# Patient Record
Sex: Female | Born: 2000 | Race: Black or African American | Hispanic: No | Marital: Single | Smoking: Never smoker
Health system: Southern US, Community
[De-identification: ages and names within clinical notes are randomized; demographics above are authoritative.]

## PROBLEM LIST (undated history)

## (undated) DIAGNOSIS — S8290XA Unspecified fracture of unspecified lower leg, initial encounter for closed fracture: Secondary | ICD-10-CM

## (undated) HISTORY — PX: LEG SURGERY: SHX1003

---
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
## 2020-06-30 IMAGING — US ULTRASOUND LEFT BREAST LIMITED
1 series · 12 of 12 positions shown · non-contrast
Comparison: Previous exam(s).

CLINICAL DATA: Patient presents for a diagnostic left breast
ultrasound to evaluate a palpable abnormality.

EXAM:
ULTRASOUND OF THE LEFT BREAST

[Series 1: ultrasound left breast limited · 0.06mm/px · 12 of 12 slices shown]
[im 1/12]
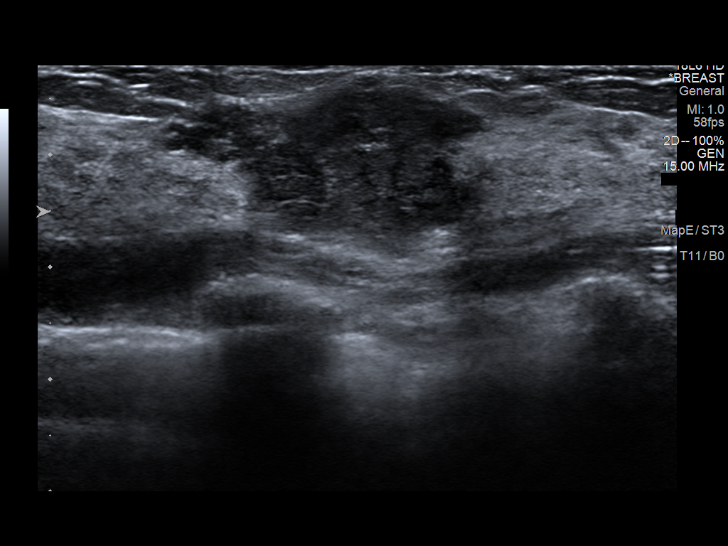
[im 2/12]
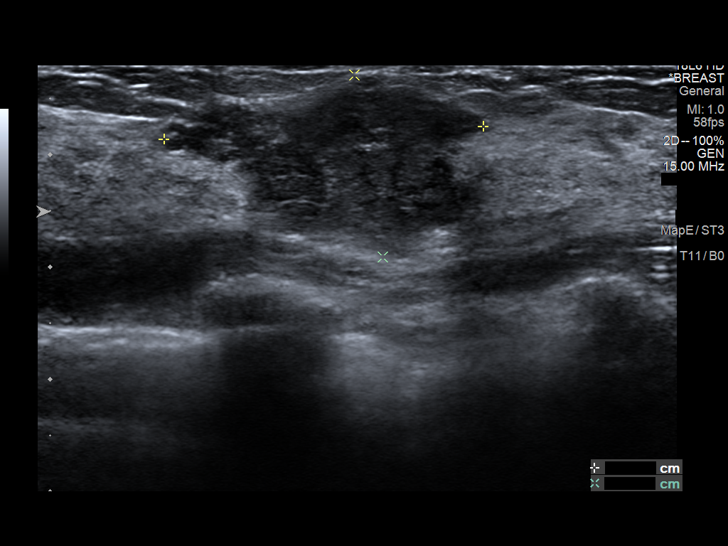
[im 3/12]
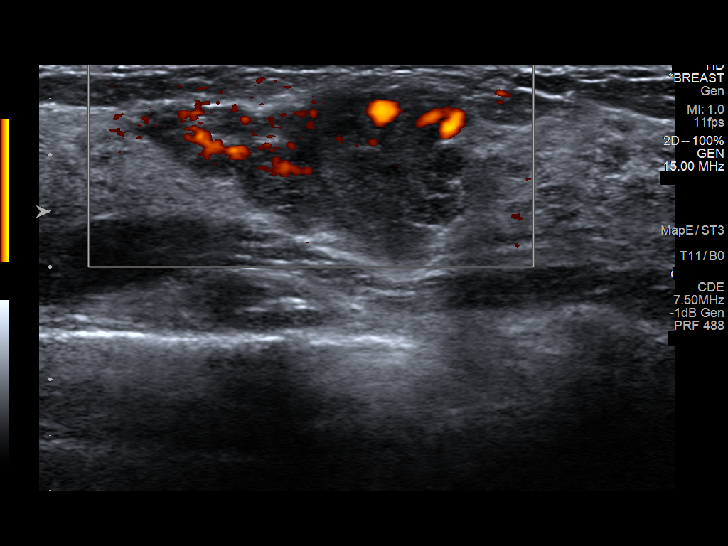
[im 4/12]
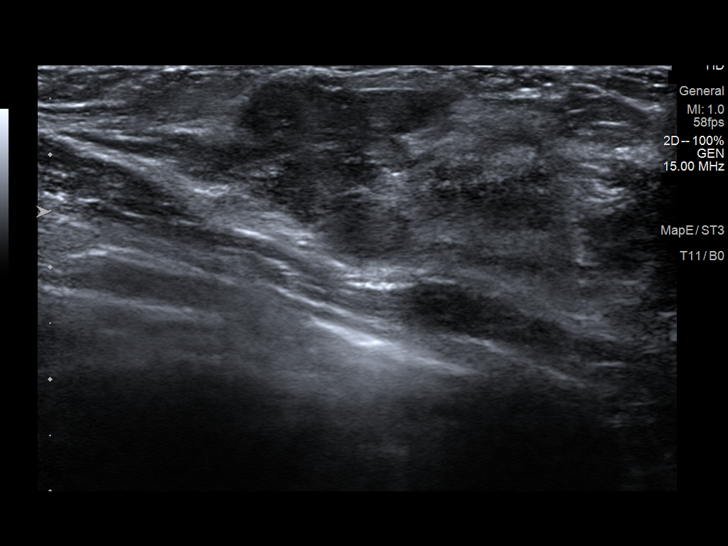
[im 5/12]
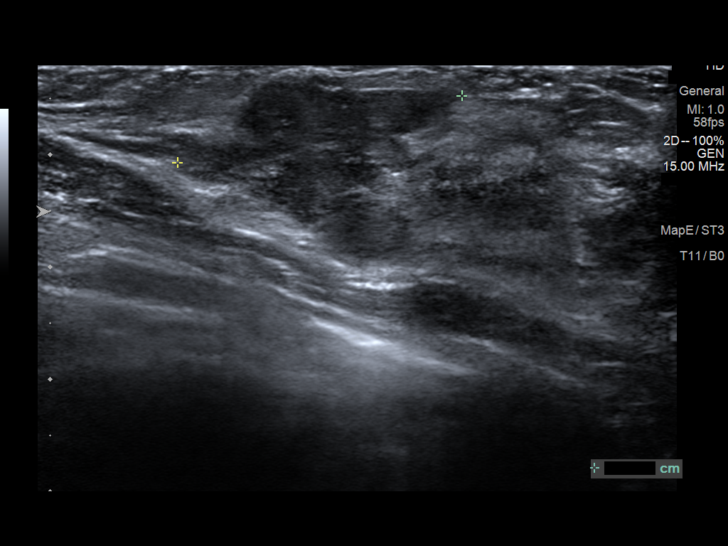
[im 6/12]
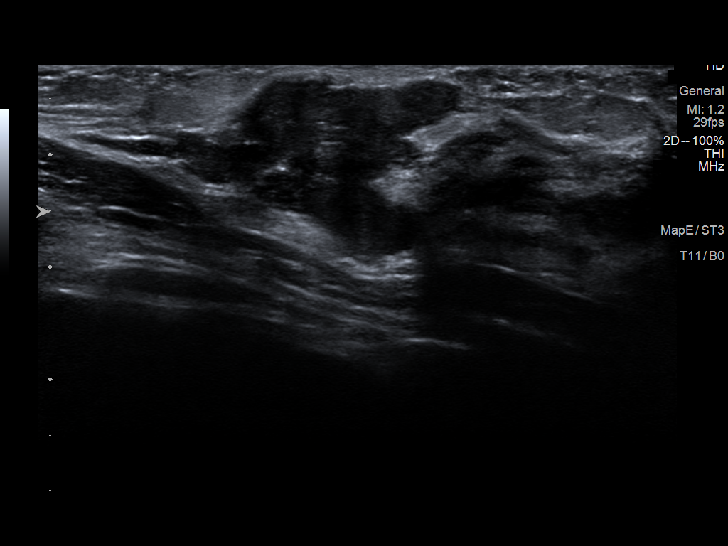
[im 7/12]
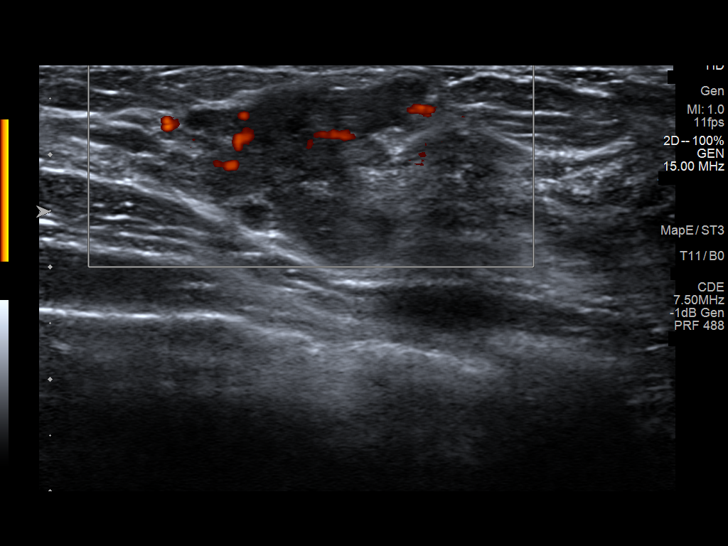
[im 8/12]
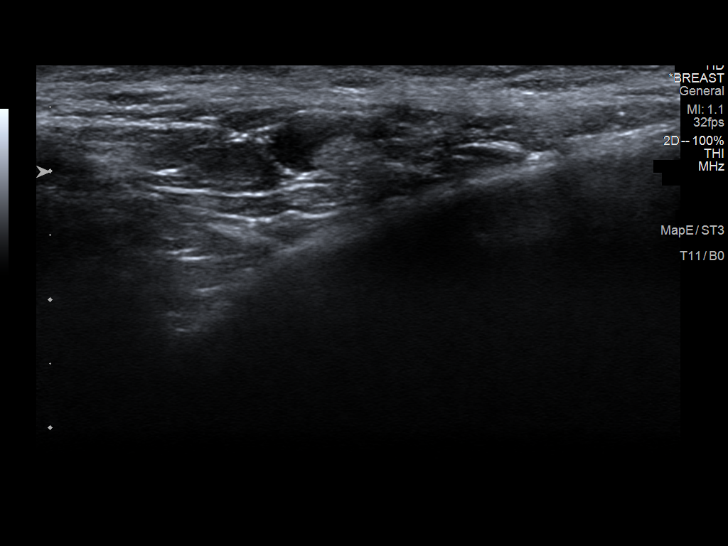
[im 9/12]
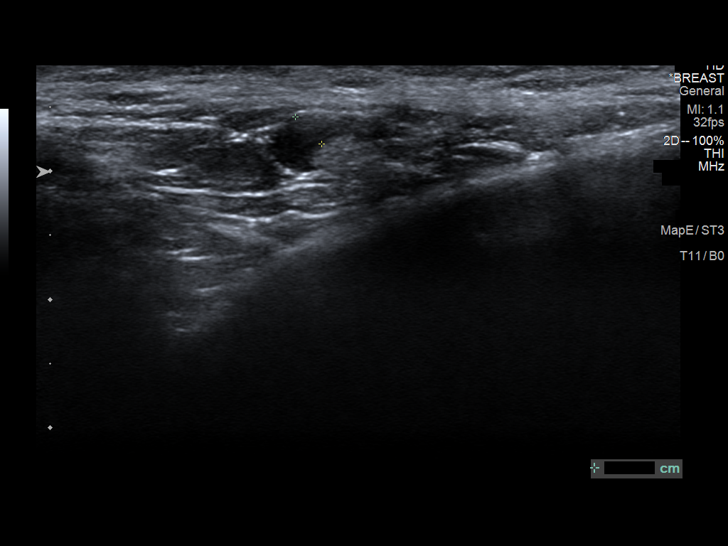
[im 10/12]
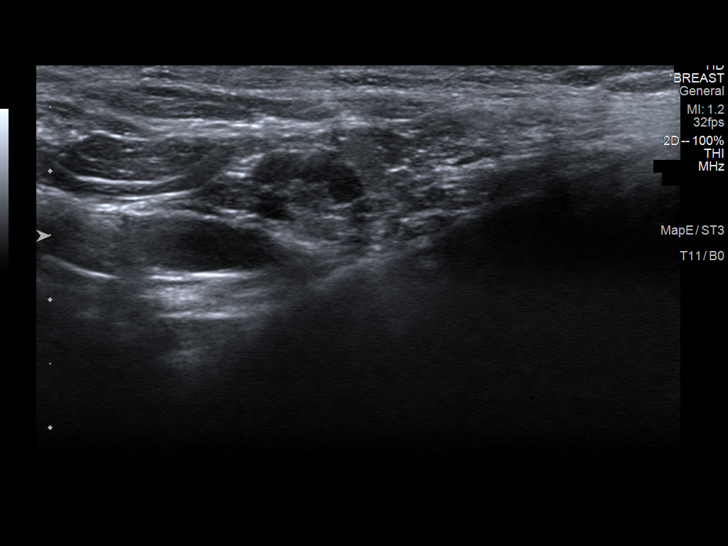
[im 11/12]
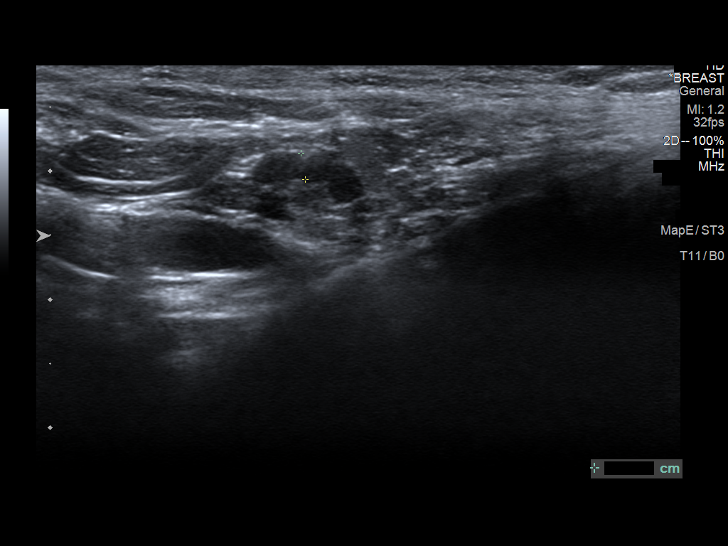
[im 12/12]
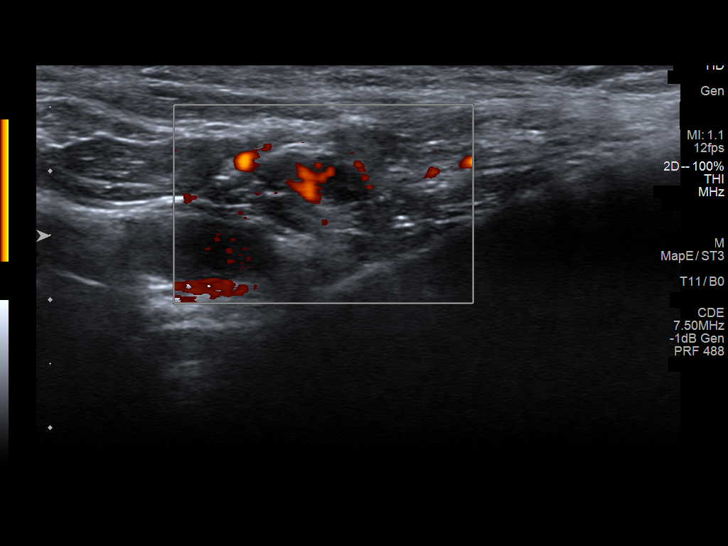

[12 of 12 positions shown; findings below may reference images not displayed]

FINDINGS: On physical exam, I palpate a 2 cm oval mobile mass over the 830-9
o'clock position of the left breast.

Targeted ultrasound is performed, showing an irregular shaped
hypoechoic mass with irregular borders over the [DATE] position of the
left breast 4 cm from the nipple measuring 1.6 x 2.6 x 2.8 cm.

Ultrasound of the left axilla demonstrates no abnormal lymph nodes.
IMPRESSION: Indeterminate 2.8 cm mass at the [DATE] position left breast 4 cm from
the nipple accounting for patient's palpable abnormality. This
likely represents an atypical fibroadenoma although is
indeterminate.

RECOMMENDATION:
Recommend ultrasound-guided core needle biopsy for further
evaluation.

I have discussed the findings and recommendations with the patient.
Results were also provided in writing at the conclusion of the
visit. If applicable, a reminder letter will be sent to the patient
regarding the next appointment.

BI-RADS CATEGORY  4: Suspicious.

Biopsy will be scheduled here at the [REDACTED]
prior to patient's departure.

## 2020-07-14 IMAGING — US US BREAST BX W LOC DEV 1ST LESION IMG BX SPEC US GUIDE*L*
1 series · 10 of 10 positions shown · non-contrast
Comparison: Previous exam(s).
COMPARISON: Previous exam(s).

Addendum:
CLINICAL DATA: 17-year-old patient with a LEFT breast mass presents
for ultrasound-guided core biopsy.

EXAM:
ULTRASOUND GUIDED LEFT BREAST CORE NEEDLE BIOPSY

[Series 1: us breast bx w loc dev 1st lesion img bx spec us g · 0.07mm/px · 10 of 10 slices shown]
[im 1/10]
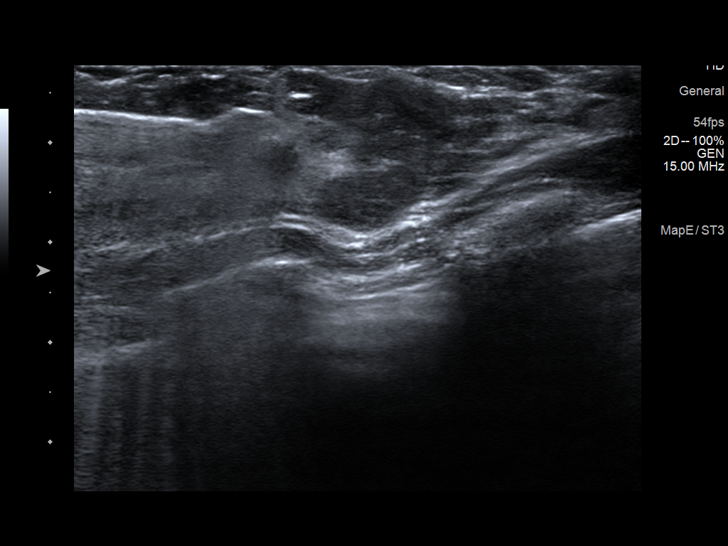
[im 2/10]
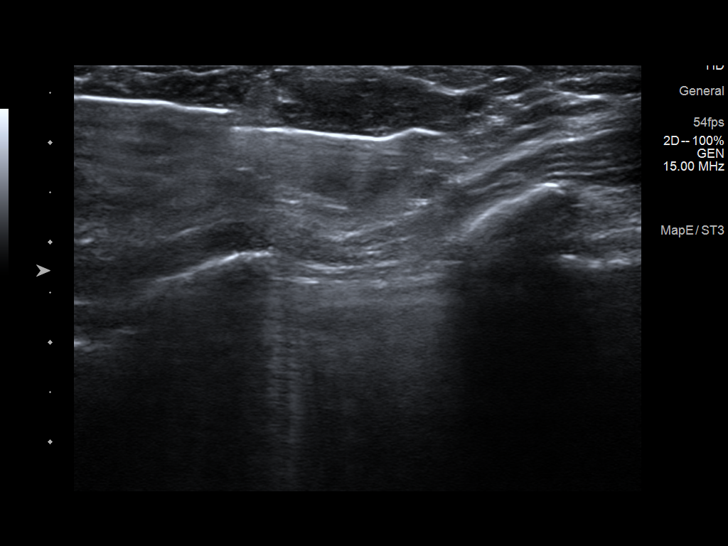
[im 3/10]
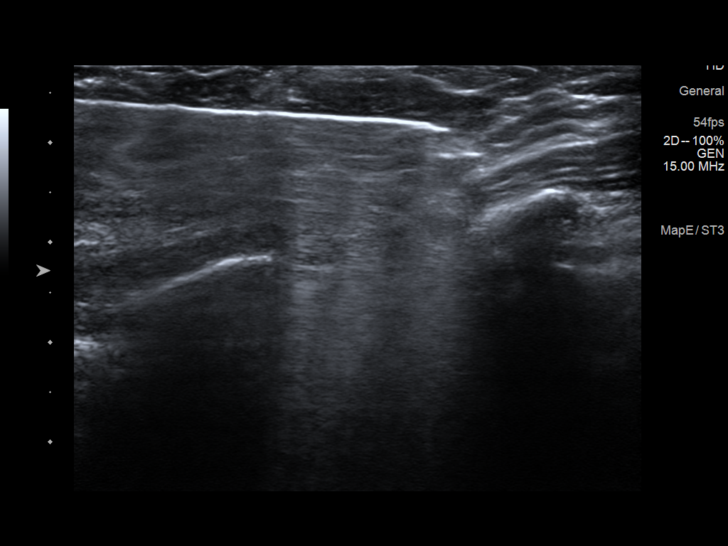
[im 4/10]
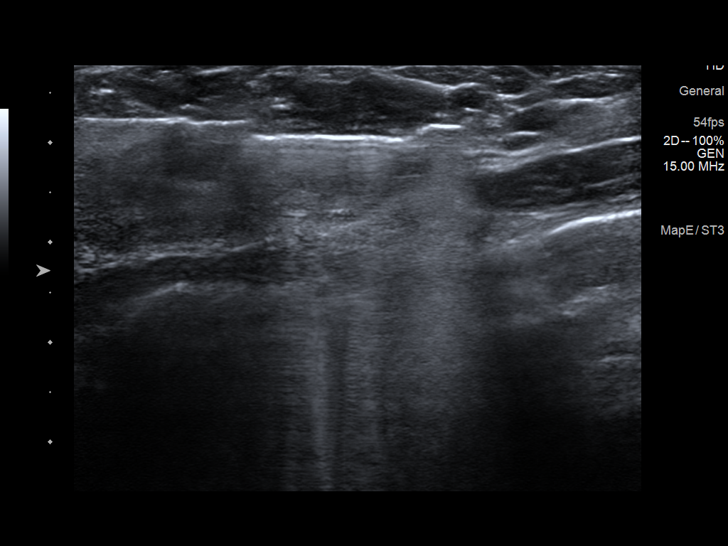
[im 5/10]
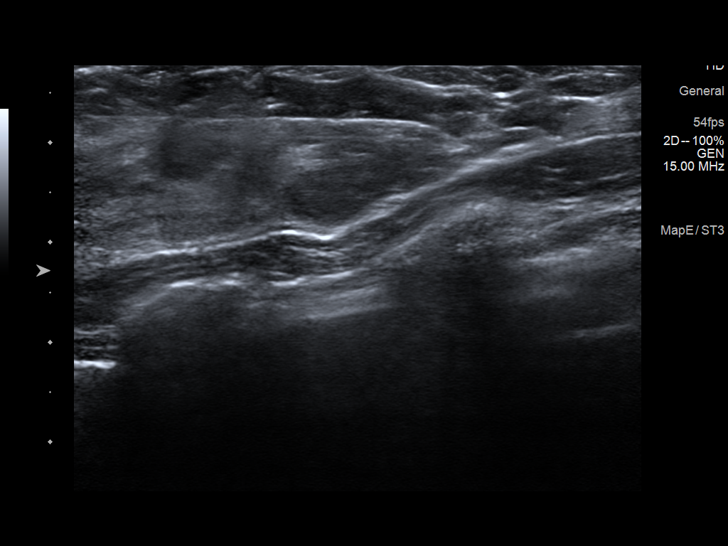
[im 6/10]
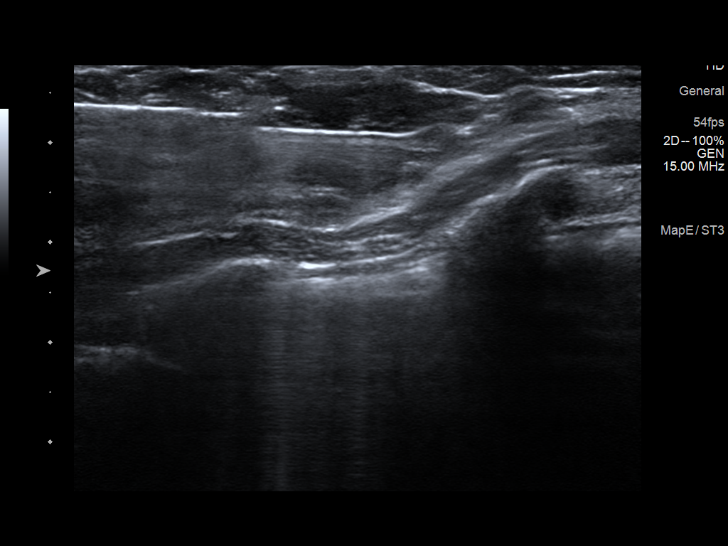
[im 7/10]
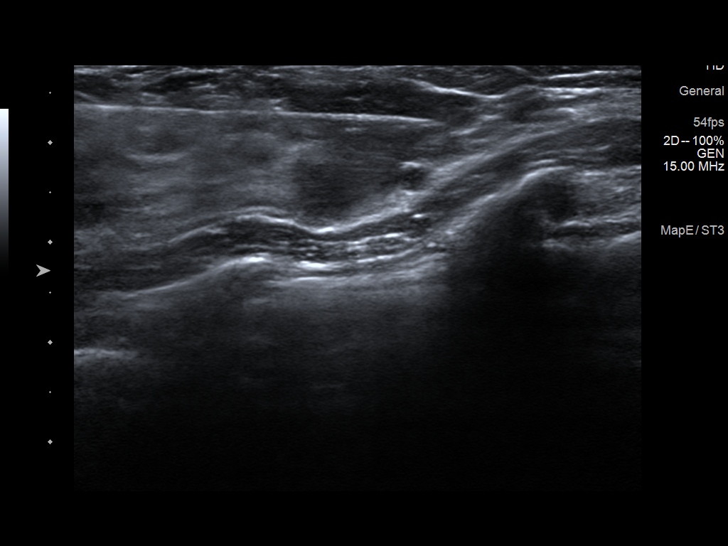
[im 8/10]
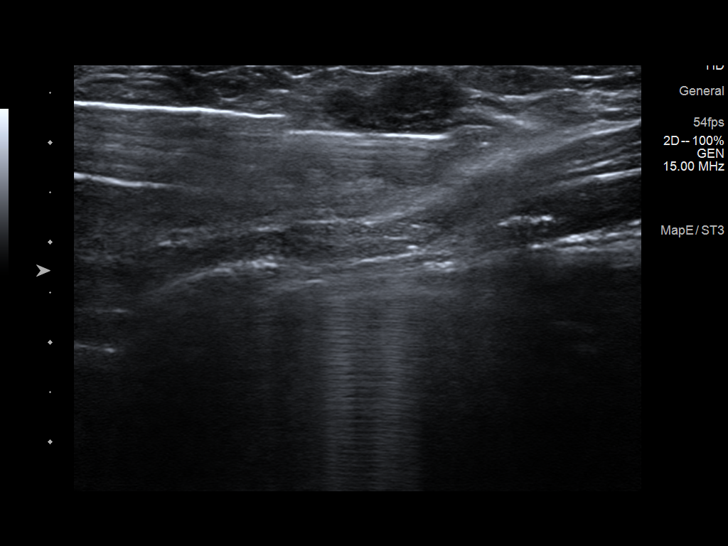
[im 9/10]
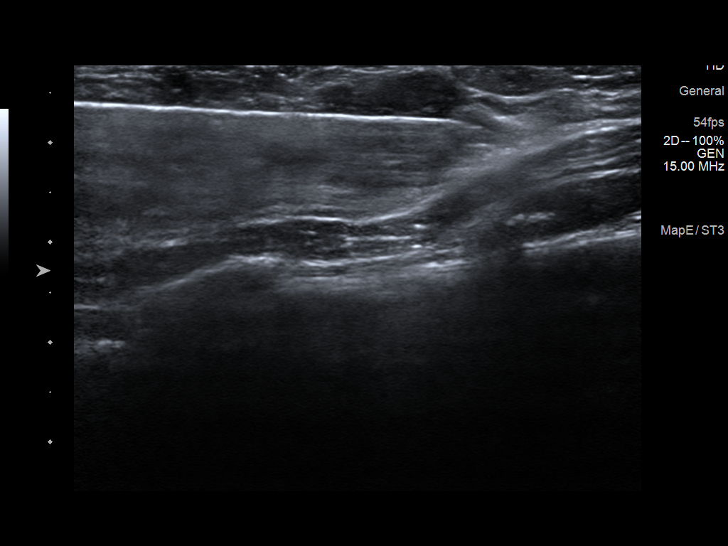
[im 10/10]
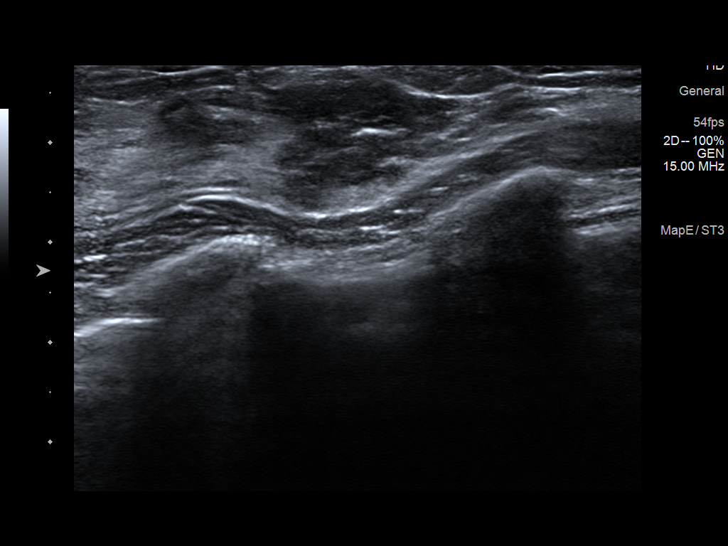

[10 of 10 positions shown; findings below may reference images not displayed]

PROCEDURE:
I met with the patient and we discussed the procedure of
ultrasound-guided biopsy, including benefits and alternatives. We
discussed the high likelihood of a successful procedure. We
discussed the risks of the procedure including infection, bleeding,
tissue injury, clip migration, and inadequate sampling. Informed
written consent was given. The usual time-out protocol was performed
immediately prior to the procedure.

Lesion quadrant: Lower inner quadrant

Using sterile technique and 1% Lidocaine as local anesthetic, under
direct ultrasound visualization, a 12 gauge Fcmerani device was
used to perform biopsy of the LEFT breast mass at the 8:30 o'clock
axisusing a inferolateral approach. At the conclusion of the
procedure, a spiral shaped HydroMARK tissue marker clip was deployed
into the biopsy cavity. Follow-up 2-view mammogram was performed and
dictated separately.
IMPRESSION: Ultrasound-guided biopsy of the LEFT breast mass at the 8:30 o'clock
axis. No apparent complications.

ADDENDUM:
Pathology revealed PAPILLARY LESION of the Left breast, 8:30
o'clock. The biopsy is of a papillary lesion with florid usual
ductal hyperplasia and apocrine metaplasia. This was found to be
concordant by Dr. Losonczi Barnoczki, with excision recommended.

Pathology results were discussed with the patient's mother, Zeinab
Muhasibat by telephone, as the patient is a minor. Mrs. Muhasibat reported
her daughter did well after the biopsy with tenderness at the site.
Post biopsy instructions and care were reviewed and questions were
answered. The patient's mother was encouraged to call [facility]

Surgical consultation has been arranged with Dr. Paulus N Ceejay
at [REDACTED] on August 21, 2018.

Pathology results reported by Dhara Tullier, RN on 08/08/2018.

*** End of Addendum ***
PROCEDURE:
I met with the patient and we discussed the procedure of
ultrasound-guided biopsy, including benefits and alternatives. We
discussed the high likelihood of a successful procedure. We
discussed the risks of the procedure including infection, bleeding,
tissue injury, clip migration, and inadequate sampling. Informed
written consent was given. The usual time-out protocol was performed
immediately prior to the procedure.

Lesion quadrant: Lower inner quadrant

Using sterile technique and 1% Lidocaine as local anesthetic, under
direct ultrasound visualization, a 12 gauge Fcmerani device was
used to perform biopsy of the LEFT breast mass at the 8:30 o'clock
axisusing a inferolateral approach. At the conclusion of the
procedure, a spiral shaped HydroMARK tissue marker clip was deployed
into the biopsy cavity. Follow-up 2-view mammogram was performed and
dictated separately.
IMPRESSION: Ultrasound-guided biopsy of the LEFT breast mass at the 8:30 o'clock
axis. No apparent complications.

## 2020-08-05 ENCOUNTER — Encounter: Payer: Self-pay | Admitting: Women's Health

## 2020-08-25 ENCOUNTER — Other Ambulatory Visit: Payer: Self-pay

## 2020-08-25 ENCOUNTER — Encounter (HOSPITAL_COMMUNITY): Payer: Self-pay

## 2020-08-25 ENCOUNTER — Emergency Department (HOSPITAL_COMMUNITY)
Admission: EM | Admit: 2020-08-25 | Discharge: 2020-08-25 | Disposition: A | Discharge status: Home / Self Care | Payer: Self-pay | Attending: Emergency Medicine | Admitting: Emergency Medicine

## 2020-08-25 DIAGNOSIS — B373 Candidiasis of vulva and vagina: Secondary | ICD-10-CM | POA: Insufficient documentation

## 2020-08-25 DIAGNOSIS — B3731 Acute candidiasis of vulva and vagina: Secondary | ICD-10-CM

## 2020-08-25 LAB — URINALYSIS, MICROSCOPIC (REFLEX)

## 2020-08-25 LAB — URINALYSIS, ROUTINE W REFLEX MICROSCOPIC
Bilirubin Urine: NEGATIVE
Glucose, UA: NEGATIVE mg/dL
Ketones, ur: NEGATIVE mg/dL
Nitrite: NEGATIVE
Protein, ur: NEGATIVE mg/dL
Specific Gravity, Urine: 1.02 (ref 1.005–1.030)
pH: 6.5 (ref 5.0–8.0)

## 2020-08-25 LAB — WET PREP, GENITAL
Clue Cells Wet Prep HPF POC: NONE SEEN
Sperm: NONE SEEN
Trich, Wet Prep: NONE SEEN

## 2020-08-25 LAB — PREGNANCY, URINE: Preg Test, Ur: NEGATIVE

## 2020-08-25 MED ORDER — FLUCONAZOLE 200 MG PO TABS: ORAL_TABLET | ORAL | 1 refills | Status: DC

## 2020-08-25 NOTE — ED Provider Notes (Signed)
[facility] EMERGENCY DEPARTMENT Provider Note   CSN: [id number] Arrival date & time: 08/25/20  1403     History No chief complaint on file.   Ann Ayers is a 20 y.o. female.  HPI     Ann Ayers is a 20 y.o. female who presents to the Emergency Department complaining of burning with urination and vaginal discharge.  Symptoms have been present for several days.  She describes having a thick white discharge from her vagina.  States she was recently treated with metronidazole for a trichomonas infection.  Her sexual partner was tested.  She endorses intercourse with same partner, but used protection.  She denies fever, chills, abdominal pain or pelvic pain, abnormal vaginal bleeding, back pain and vomiting.  She is here requesting STI testing.  Past Medical History:  Diagnosis Date   Broken lower leg     There are no problems to display for this patient.   Past Surgical History:  Procedure Laterality Date   LEG SURGERY       OB History   No obstetric history on file.     History reviewed. No pertinent family history.  Social History   Tobacco Use   Smoking status: Never   Smokeless tobacco: Never  Substance Use Topics   Alcohol use: No   Drug use: No    Home Medications Prior to Admission medications   Medication Sig Start Date End Date Taking? Authorizing Provider  fluconazole (DIFLUCAN) 200 MG tablet Take 1 pill as a single dose.  May repeat in 7 days. 08/25/20  Yes Mackensi Mahadeo, PA-C  dexamethasone (DECADRON) 4 MG tablet Take 1 tablet (4 mg total) by mouth 2 (two) times daily with a meal. Patient not taking: Reported on 08/25/2020 06/04/17   Ivery Quale, PA-C  ibuprofen (ADVIL,MOTRIN) 400 MG tablet 1 tab with breakfast, lunch, dinner and hs. Patient not taking: Reported on 08/25/2020 06/04/17   Ivery Quale, PA-C  traMADol (ULTRAM) 50 MG tablet Take 1 tablet (50 mg total) by mouth every 6 (six) hours as needed. Patient not taking: Reported on  08/25/2020 06/04/17   Ivery Quale, PA-C    Allergies    Patient has no known allergies.  Review of Systems   Review of Systems  Constitutional:  Negative for chills, fatigue and fever.  Respiratory:  Negative for shortness of breath.   Cardiovascular:  Negative for chest pain.  Gastrointestinal:  Negative for abdominal pain, blood in stool, constipation, diarrhea, nausea and vomiting.  Genitourinary:  Positive for dysuria and vaginal discharge. Negative for decreased urine volume, difficulty urinating, flank pain, hematuria, menstrual problem and vaginal bleeding.  Musculoskeletal:  Negative for arthralgias, back pain, myalgias, neck pain and neck stiffness.  Skin:  Negative for rash.  Neurological:  Negative for dizziness, weakness and numbness.  Hematological:  Does not bruise/bleed easily.   Physical Exam Updated Vital Signs BP 132/80   Pulse 78   Temp 98.4 F (36.9 C) (Oral)   Resp 18   Ht 5\' 1"  (1.549 m)   Wt 49.9 kg   LMP 08/14/2020   SpO2 100%   BMI 20.78 kg/m   Physical Exam Exam conducted with a chaperone present.  Constitutional:      Appearance: Normal appearance.  HENT:     Head: Normocephalic.  Eyes:     Pupils: Pupils are equal, round, and reactive to light.  Neck:     Thyroid: No thyromegaly.     Meningeal: Kernig's sign absent.  Cardiovascular:  Rate and Rhythm: Normal rate and regular rhythm.     Heart sounds: Normal heart sounds.  Pulmonary:     Effort: Pulmonary effort is normal.     Breath sounds: Normal breath sounds. No wheezing.  Abdominal:     Palpations: Abdomen is soft.     Tenderness: There is no abdominal tenderness. There is no guarding or rebound.  Genitourinary:    General: Normal vulva.     Labia:        Right: No rash.        Left: No rash.      Vagina: No foreign body. Vaginal discharge present. No tenderness or bleeding.     Cervix: No cervical motion tenderness, discharge, erythema or cervical bleeding.     Uterus:  Normal. Not tender.      Adnexa:        Right: No mass or tenderness.         Left: No mass or tenderness.       Comments: Pelvic exam shows thick white discharge present in the vaginal vault.  No cervical motion tenderness, no bleeding or discharge from the cervix.  No palpable adnexal masses or tenderness. Musculoskeletal:        General: Normal range of motion.     Cervical back: Normal range of motion and neck supple.  Skin:    General: Skin is warm and dry.     Findings: No rash.  Neurological:     Mental Status: She is alert and oriented to person, place, and time.    ED Results / Procedures / Treatments   Labs (all labs ordered are listed, but only abnormal results are displayed) Labs Reviewed  WET PREP, GENITAL - Abnormal; Notable for the following components:      Result Value   Yeast Wet Prep HPF POC PRESENT (*)    WBC, Wet Prep HPF POC MODERATE (*)    All other components within normal limits  URINALYSIS, ROUTINE W REFLEX MICROSCOPIC - Abnormal; Notable for the following components:   Hgb urine dipstick TRACE (*)    Leukocytes,Ua LARGE (*)    All other components within normal limits  URINALYSIS, MICROSCOPIC (REFLEX) - Abnormal; Notable for the following components:   Bacteria, UA FEW (*)    All other components within normal limits  PREGNANCY, URINE  GC/CHLAMYDIA PROBE AMP ([place]) NOT AT [facility]    EKG None  Radiology No results found.  Procedures Procedures   Medications Ordered in ED Medications - No data to display  ED Course  I have reviewed the triage vital signs and the nursing notes.  Pertinent labs & imaging results that were available during my care of the patient were reviewed by me and considered in my medical decision making (see chart for details).    MDM Rules/Calculators/A&P                          Patient here for evaluation of possible STI.  She was recently treated with metronidazole for trichomonas.  Endorses same sexual  partner and protected intercourse.  Symptoms have been present for several days.  No CVA tenderness, abdominal pain, fever, chills, vomiting or diarrhea.  Pelvic exam performed by me, thick white discharge present in vaginal vault likely related to Candida infection.  Wet prep shows presence of yeast.  GC chlamydia pending.  Pregnancy test negative .  Urinalysis without evidence of cystitis.  Patient well-appearing, vital signs  reassuring.  No concerning symptoms for TOA or abdominal/pelvic pain to suggest ovarian torsion. Patient will be treated with Diflucan.  She agrees to close outpatient follow-up with her gynecologist.  Appropriate for discharge home.   Final Clinical Impression(s) / ED Diagnoses Final diagnoses:  Vaginal candidiasis    Rx / DC Orders ED Discharge Orders          Ordered    fluconazole (DIFLUCAN) 200 MG tablet        08/25/20 1913             Pauline Aus, PA-C 08/25/20 1925    Terrilee Files, MD 08/26/20 1139

## 2020-08-25 NOTE — ED Triage Notes (Signed)
Pt here for STI testing, states she does not know why she suspects having an STI but just wants to make sure. Pt denies being sexually active and does not have a PCP. States d/c since this morning, thick white, denies burning or pain.

## 2020-08-25 NOTE — Discharge Instructions (Addendum)
Your test today shows that you have a yeast infection.  Your remaining results are pending.  You will be notified of any additional positive results.  Please take the Diflucan tablet as directed.  You may repeat in 7 days if your symptoms are not completely resolved.  Follow-up with your gynecologist for recheck if needed.

## 2020-08-26 LAB — GC/CHLAMYDIA PROBE AMP (~~LOC~~) NOT AT ARMC
Chlamydia: NEGATIVE
Comment: NEGATIVE
Comment: NORMAL
Neisseria Gonorrhea: NEGATIVE

## 2020-09-05 ENCOUNTER — Encounter (HOSPITAL_COMMUNITY): Payer: Self-pay | Admitting: Emergency Medicine

## 2020-09-05 ENCOUNTER — Emergency Department (HOSPITAL_COMMUNITY): Payer: No Typology Code available for payment source

## 2020-09-05 ENCOUNTER — Emergency Department (HOSPITAL_COMMUNITY)
Admission: EM | Admit: 2020-09-05 | Discharge: 2020-09-05 | Disposition: A | Discharge status: Home / Self Care | Payer: No Typology Code available for payment source | Attending: Emergency Medicine | Admitting: Emergency Medicine

## 2020-09-05 ENCOUNTER — Other Ambulatory Visit: Payer: Self-pay

## 2020-09-05 DIAGNOSIS — J3489 Other specified disorders of nose and nasal sinuses: Secondary | ICD-10-CM | POA: Diagnosis not present

## 2020-09-05 DIAGNOSIS — Y92410 Unspecified street and highway as the place of occurrence of the external cause: Secondary | ICD-10-CM | POA: Insufficient documentation

## 2020-09-05 DIAGNOSIS — M542 Cervicalgia: Secondary | ICD-10-CM | POA: Diagnosis not present

## 2020-09-05 MED ORDER — METHOCARBAMOL 500 MG PO TABS: 500.0000 mg | ORAL_TABLET | Freq: Two times a day (BID) | ORAL | 0 refills | Status: AC
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
## 2020-09-05 NOTE — Discharge Instructions (Signed)
Take NSAIDs or Tylenol as needed for the next week. Take this medicine with food. Take muscle relaxer at bedtime to help you sleep. This medicine makes you drowsy so do not take before driving or work Use a heating pad for sore muscles - use for 20 minutes several times a day Try gentle range of motion exercises Return for worsening symptoms  

## 2020-09-30 ENCOUNTER — Telehealth: Payer: No Typology Code available for payment source | Admitting: Physician Assistant

## 2020-09-30 DIAGNOSIS — N760 Acute vaginitis: Secondary | ICD-10-CM

## 2020-09-30 DIAGNOSIS — B9689 Other specified bacterial agents as the cause of diseases classified elsewhere: Secondary | ICD-10-CM

## 2020-09-30 MED ORDER — METRONIDAZOLE 500 MG PO TABS: 500.0000 mg | ORAL_TABLET | Freq: Two times a day (BID) | ORAL | 0 refills | Status: AC

## 2020-09-30 NOTE — Progress Notes (Signed)

## 2020-11-12 ENCOUNTER — Telehealth: Payer: Self-pay | Admitting: Physician Assistant

## 2020-11-12 NOTE — Progress Notes (Signed)
Patient wanting new start birth control for dysmenorrhea. Advised, unfortunately, at this time we are unable to start this for her. She will f/u with her local county health department Tuesday

## 2021-01-23 ENCOUNTER — Other Ambulatory Visit: Payer: Self-pay | Admitting: Physician Assistant

## 2021-01-23 DIAGNOSIS — B9689 Other specified bacterial agents as the cause of diseases classified elsewhere: Secondary | ICD-10-CM

## 2021-01-23 DIAGNOSIS — N760 Acute vaginitis: Secondary | ICD-10-CM

## 2021-02-28 ENCOUNTER — Encounter (HOSPITAL_COMMUNITY): Payer: Self-pay | Admitting: *Deleted

## 2021-02-28 ENCOUNTER — Emergency Department (HOSPITAL_COMMUNITY)
Admission: EM | Admit: 2021-02-28 | Discharge: 2021-02-28 | Disposition: A | Discharge status: Home / Self Care | Payer: Self-pay | Attending: Emergency Medicine | Admitting: Emergency Medicine

## 2021-02-28 DIAGNOSIS — S6992XA Unspecified injury of left wrist, hand and finger(s), initial encounter: Secondary | ICD-10-CM | POA: Insufficient documentation

## 2021-02-28 NOTE — Discharge Instructions (Signed)
Go to nail salon to have nails removed.

## 2021-02-28 NOTE — ED Triage Notes (Signed)
Requesting left middle fingernail removal
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
## 2021-07-29 ENCOUNTER — Telehealth: Payer: No Typology Code available for payment source | Admitting: Family

## 2021-07-29 DIAGNOSIS — N898 Other specified noninflammatory disorders of vagina: Secondary | ICD-10-CM

## 2021-07-29 DIAGNOSIS — R109 Unspecified abdominal pain: Secondary | ICD-10-CM

## 2021-07-30 NOTE — Progress Notes (Signed)
Based on what you shared with me, I feel your condition warrants further evaluation and I recommend that you be seen in a face to face visit.  Given you are having vaginal discharge and stomach pains, you need to be seen in person for further testing to rule out a more serious infection.    NOTE: There will be NO CHARGE for this eVisit   If you are having a true medical emergency please call 911.      For an urgent face to face visit, [facility] has six urgent care centers for your convenience:     [facility] Health Urgent Care Center at [facility] Directions [phone] [address] [place], [place] [address]    [facility] Health Urgent Care Center [facility]) Get Driving Directions [phone] [address] [place], [place] [address]  [facility] Health Urgent Care Center [facility]) Get Driving Directions [phone] [address] [place],  [place]  [address]  [facility] Health Urgent Care at [facility] Get Driving Directions [phone] [address] [place], [place] [address]   [facility] Health Urgent Care at [facility] Get Driving Directions  [phone] [address] [place], [place] [address]   [facility] Health Urgent Care at [facility] Directions [phone] [address] [place], [place] [address]  Your MyChart E-visit questionnaire answers were reviewed by a board certified advanced clinical practitioner to complete your personal care plan based on your specific symptoms.  Thank you for using e-Visits.

## 2021-08-03 ENCOUNTER — Telehealth: Payer: No Typology Code available for payment source | Admitting: Physician Assistant

## 2021-08-03 DIAGNOSIS — N898 Other specified noninflammatory disorders of vagina: Secondary | ICD-10-CM

## 2021-08-03 NOTE — Progress Notes (Signed)
Based on what you shared with me, I feel your condition warrants further evaluation and I recommend that you be seen in a face to face visit. Giving vaginal lesions mentioned along with the discharge, and I can see you had a visit for this recently with mention of abdominal pain, you still need to be evaluated in person so that proper diagnosis and treatment can be given.    NOTE: There will be NO CHARGE for this eVisit   If you are having a true medical emergency please call 911.      For an urgent face to face visit, [facility] has six urgent care centers for your convenience:     [facility] Health Urgent Care Center at [facility] Directions [phone] [address] [place], [place] [address]    [facility] Health Urgent Care Center [facility]) Get Driving Directions [phone] [address] [place], [place] [address]  [facility] Health Urgent Care Center [facility]) Get Driving Directions [phone] [address] [place],  [place]  [address]  [facility] Health Urgent Care at [facility] Get Driving Directions [phone] [address] [place], [place] [address]   [facility] Health Urgent Care at [facility] Get Driving Directions  [phone] [address] [place], [place] [address]   [facility] Health Urgent Care at [facility] Directions [phone] [address] [place], [place] [address]  Your MyChart E-visit questionnaire answers were reviewed by a board certified advanced clinical practitioner to complete your personal care plan based on your specific symptoms.  Thank you for using e-Visits.

## 2021-09-18 ENCOUNTER — Telehealth: Payer: No Typology Code available for payment source | Admitting: Physician Assistant

## 2021-09-18 DIAGNOSIS — B3731 Acute candidiasis of vulva and vagina: Secondary | ICD-10-CM

## 2021-09-18 MED ORDER — FLUCONAZOLE 150 MG PO TABS: 150.0000 mg | ORAL_TABLET | ORAL | 0 refills | Status: DC | PRN

## 2021-09-18 NOTE — Progress Notes (Signed)

## 2021-12-25 ENCOUNTER — Telehealth: Payer: No Typology Code available for payment source | Admitting: Emergency Medicine

## 2021-12-25 DIAGNOSIS — N760 Acute vaginitis: Secondary | ICD-10-CM

## 2021-12-25 MED ORDER — FLUCONAZOLE 150 MG PO TABS: 150.0000 mg | ORAL_TABLET | Freq: Once | ORAL | 0 refills | Status: AC

## 2021-12-25 NOTE — Progress Notes (Signed)

## 2022-08-20 ENCOUNTER — Other Ambulatory Visit: Payer: Self-pay | Admitting: Nurse Practitioner

## 2022-08-20 DIAGNOSIS — N920 Excessive and frequent menstruation with regular cycle: Secondary | ICD-10-CM

## 2022-08-30 ENCOUNTER — Ambulatory Visit (HOSPITAL_COMMUNITY)
Admission: RE | Admit: 2022-08-30 | Discharge: 2022-08-30 | Disposition: A | Discharge status: Home / Self Care | Payer: Medicaid Other | Source: Ambulatory Visit | Attending: Nurse Practitioner | Admitting: Nurse Practitioner

## 2022-08-30 DIAGNOSIS — N920 Excessive and frequent menstruation with regular cycle: Secondary | ICD-10-CM | POA: Insufficient documentation

## 2022-10-02 ENCOUNTER — Telehealth: Payer: Medicaid Other | Admitting: Physician Assistant

## 2022-10-02 DIAGNOSIS — N898 Other specified noninflammatory disorders of vagina: Secondary | ICD-10-CM

## 2022-10-02 MED ORDER — METRONIDAZOLE 500 MG PO TABS: 500.0000 mg | ORAL_TABLET | Freq: Two times a day (BID) | ORAL | 0 refills | Status: AC

## 2022-10-02 NOTE — Progress Notes (Deleted)
Patient never responded to additional questions prior to end of shift. Visit closed. Will mark no-charge for now while awaiting patient response.

## 2022-10-02 NOTE — Progress Notes (Signed)
Message sent to patient requesting further input regarding current symptoms. Awaiting patient response.  

## 2022-10-02 NOTE — Addendum Note (Signed)
Addended by: Waldon Merl on: 10/02/2022 08:08 PM   Modules accepted: Orders, Level of Service

## 2022-10-02 NOTE — Progress Notes (Signed)

## 2022-10-02 NOTE — Progress Notes (Signed)
I have spent 5 minutes in review of e-visit questionnaire, review and updating patient chart, medical decision making and response to patient.   William Cody Martin, PA-C
